# Patient Record
Sex: Female | Born: 1970 | Race: Black or African American | Hispanic: No | Marital: Single | Smoking: Never smoker
Health system: Southern US, Community
[De-identification: ages and names within clinical notes are randomized; demographics above are authoritative.]

## PROBLEM LIST (undated history)

## (undated) DIAGNOSIS — E119 Type 2 diabetes mellitus without complications: Secondary | ICD-10-CM

## (undated) DIAGNOSIS — R51 Headache: Secondary | ICD-10-CM

## (undated) DIAGNOSIS — I10 Essential (primary) hypertension: Secondary | ICD-10-CM

## (undated) DIAGNOSIS — R519 Headache, unspecified: Secondary | ICD-10-CM

---

## 2003-02-11 ENCOUNTER — Inpatient Hospital Stay (HOSPITAL_COMMUNITY): Admission: AD | Admit: 2003-02-11 | Discharge: 2003-02-11 | Payer: Self-pay | Admitting: Obstetrics & Gynecology

## 2003-02-12 ENCOUNTER — Encounter: Payer: Self-pay | Admitting: Obstetrics & Gynecology

## 2003-02-12 ENCOUNTER — Ambulatory Visit (HOSPITAL_COMMUNITY): Admission: RE | Admit: 2003-02-12 | Discharge: 2003-02-12 | Payer: Self-pay | Admitting: Family Medicine

## 2003-03-19 ENCOUNTER — Encounter: Payer: Self-pay | Admitting: Obstetrics

## 2003-03-19 ENCOUNTER — Ambulatory Visit (HOSPITAL_COMMUNITY): Admission: RE | Admit: 2003-03-19 | Discharge: 2003-03-19 | Payer: Self-pay | Admitting: Obstetrics

## 2004-06-09 ENCOUNTER — Ambulatory Visit (HOSPITAL_COMMUNITY): Admission: RE | Admit: 2004-06-09 | Discharge: 2004-06-09 | Payer: Self-pay | Admitting: Gastroenterology

## 2013-08-14 ENCOUNTER — Observation Stay: Payer: Self-pay | Admitting: Internal Medicine

## 2013-08-14 LAB — CK TOTAL AND CKMB (NOT AT ARMC)
CK, Total: 128 U/L (ref 21–215)
CK, Total: 115 U/L (ref 21–215)
CK-MB: 1.6 ng/mL (ref 0.5–3.6)
CK-MB: 1 ng/mL (ref 0.5–3.6)

## 2013-08-14 LAB — CBC
HCT: 41.7 % (ref 35.0–47.0)
HGB: 13.9 g/dL (ref 12.0–16.0)
MCH: 30.1 pg (ref 26.0–34.0)
Platelet: 254 10*3/uL (ref 150–440)
RBC: 4.61 10*6/uL (ref 3.80–5.20)

## 2013-08-14 LAB — COMPREHENSIVE METABOLIC PANEL
Albumin: 3.5 g/dL (ref 3.4–5.0)
Alkaline Phosphatase: 88 U/L
BUN: 10 mg/dL (ref 7–18)
Bilirubin,Total: 0.3 mg/dL (ref 0.2–1.0)
Chloride: 103 mmol/L (ref 98–107)
Glucose: 209 mg/dL — ABNORMAL HIGH (ref 65–99)
Potassium: 3.9 mmol/L (ref 3.5–5.1)
Sodium: 136 mmol/L (ref 136–145)
Total Protein: 7.3 g/dL (ref 6.4–8.2)

## 2013-08-14 LAB — HEMOGLOBIN A1C: Hemoglobin A1C: 9 % — ABNORMAL HIGH (ref 4.2–6.3)

## 2013-08-14 LAB — TROPONIN I
Troponin-I: <0.02 ng/mL
Troponin-I: <0.02 ng/mL
Troponin-I: 0.03 ng/mL

## 2013-08-15 LAB — BASIC METABOLIC PANEL
BUN: 10 mg/dL (ref 7–18)
Chloride: 105 mmol/L (ref 98–107)
Co2: 28 mmol/L (ref 21–32)
Creatinine: 0.66 mg/dL (ref 0.60–1.30)
EGFR (African American): >60
EGFR (Non-African Amer.): >60
Osmolality: 278 (ref 275–301)
Potassium: 4 mmol/L (ref 3.5–5.1)
Sodium: 137 mmol/L (ref 136–145)

## 2014-09-27 IMAGING — CR DG CHEST 2V
1 series · 2 of 2 positions shown · non-contrast
Comparison: August 22, 2004

CLINICAL DATA: Chest pain and shortness of breath

EXAM:
CHEST  2 VIEW

[Series 1: w chest pa · 0.14mm/px · 2 of 2 slices shown]
[im 1/2]
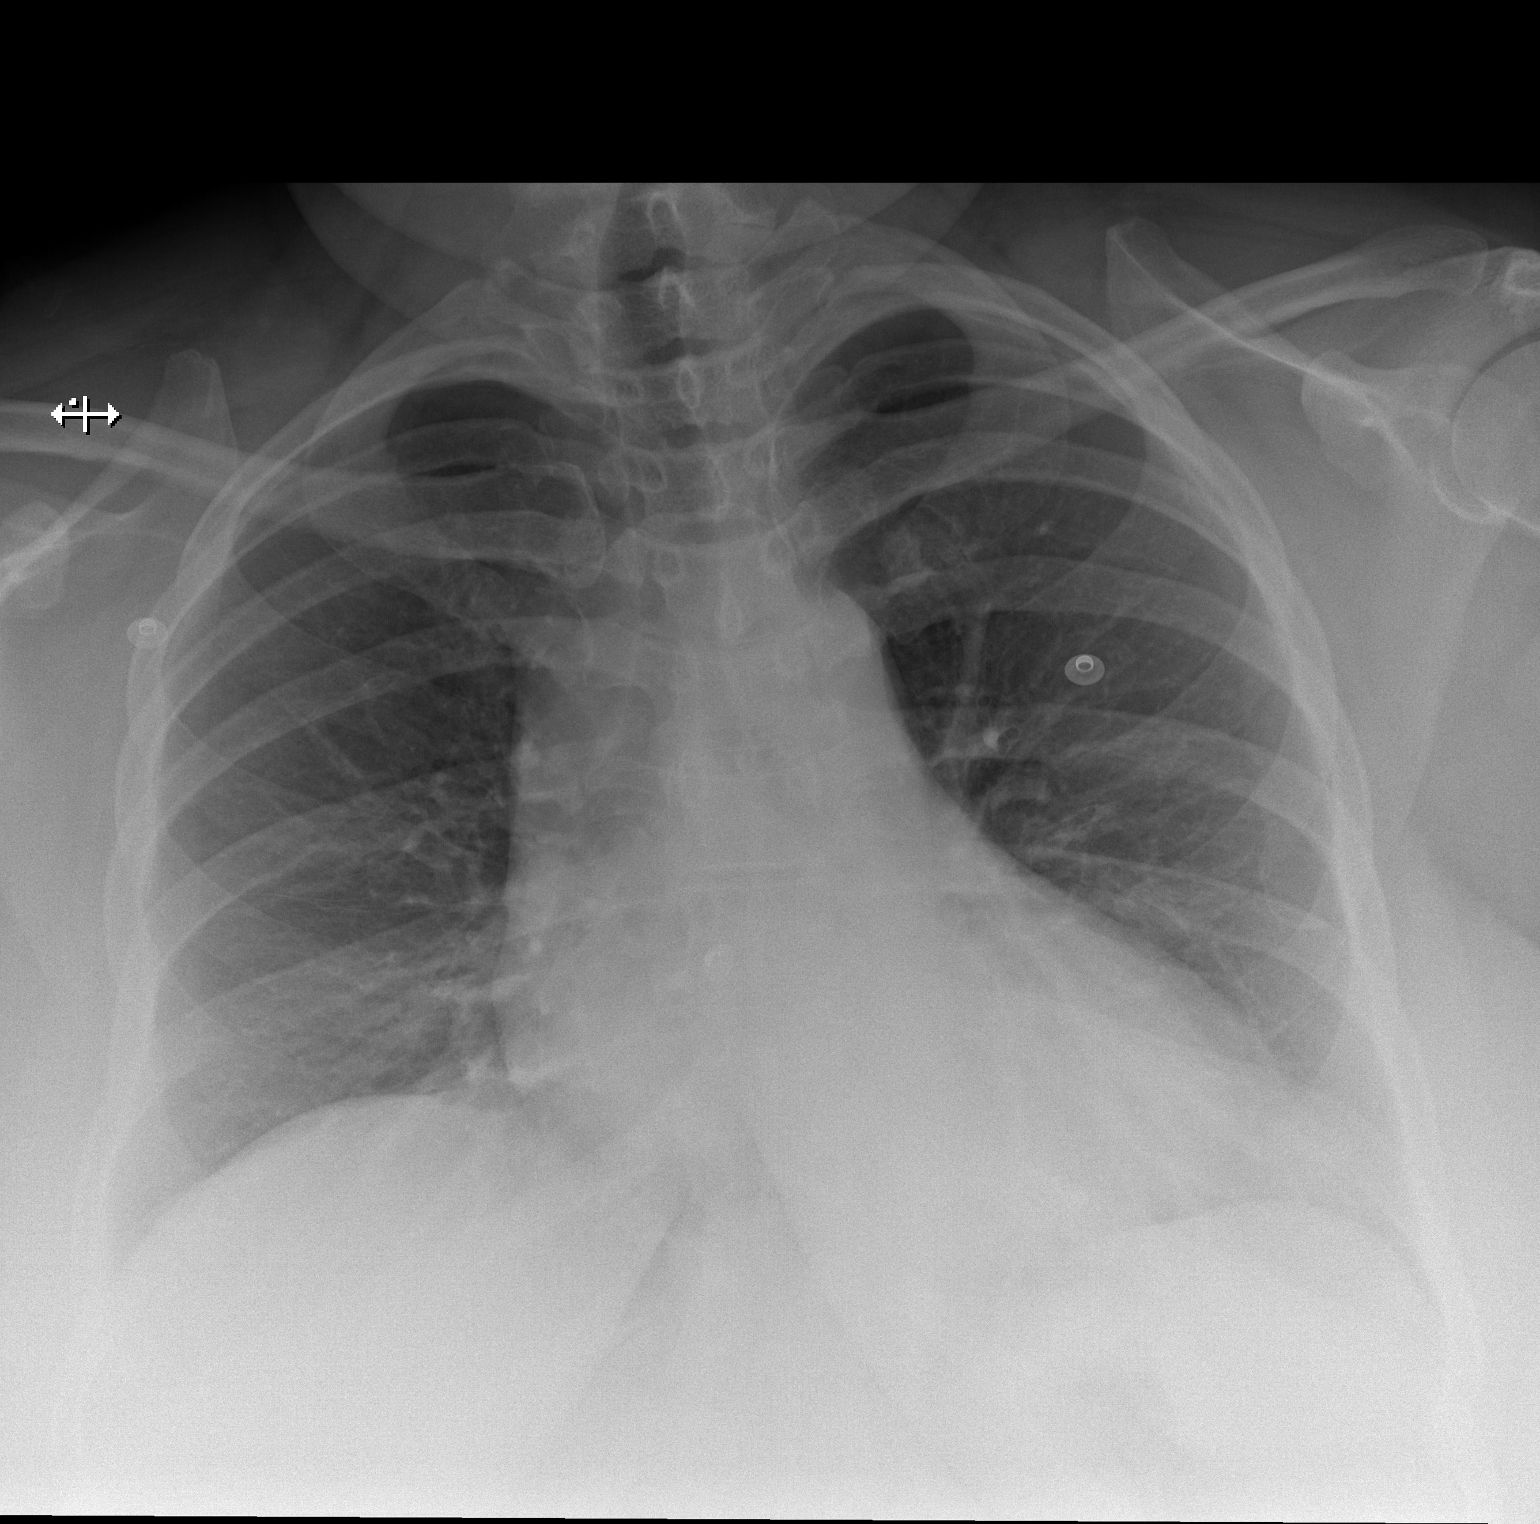
[im 2/2]
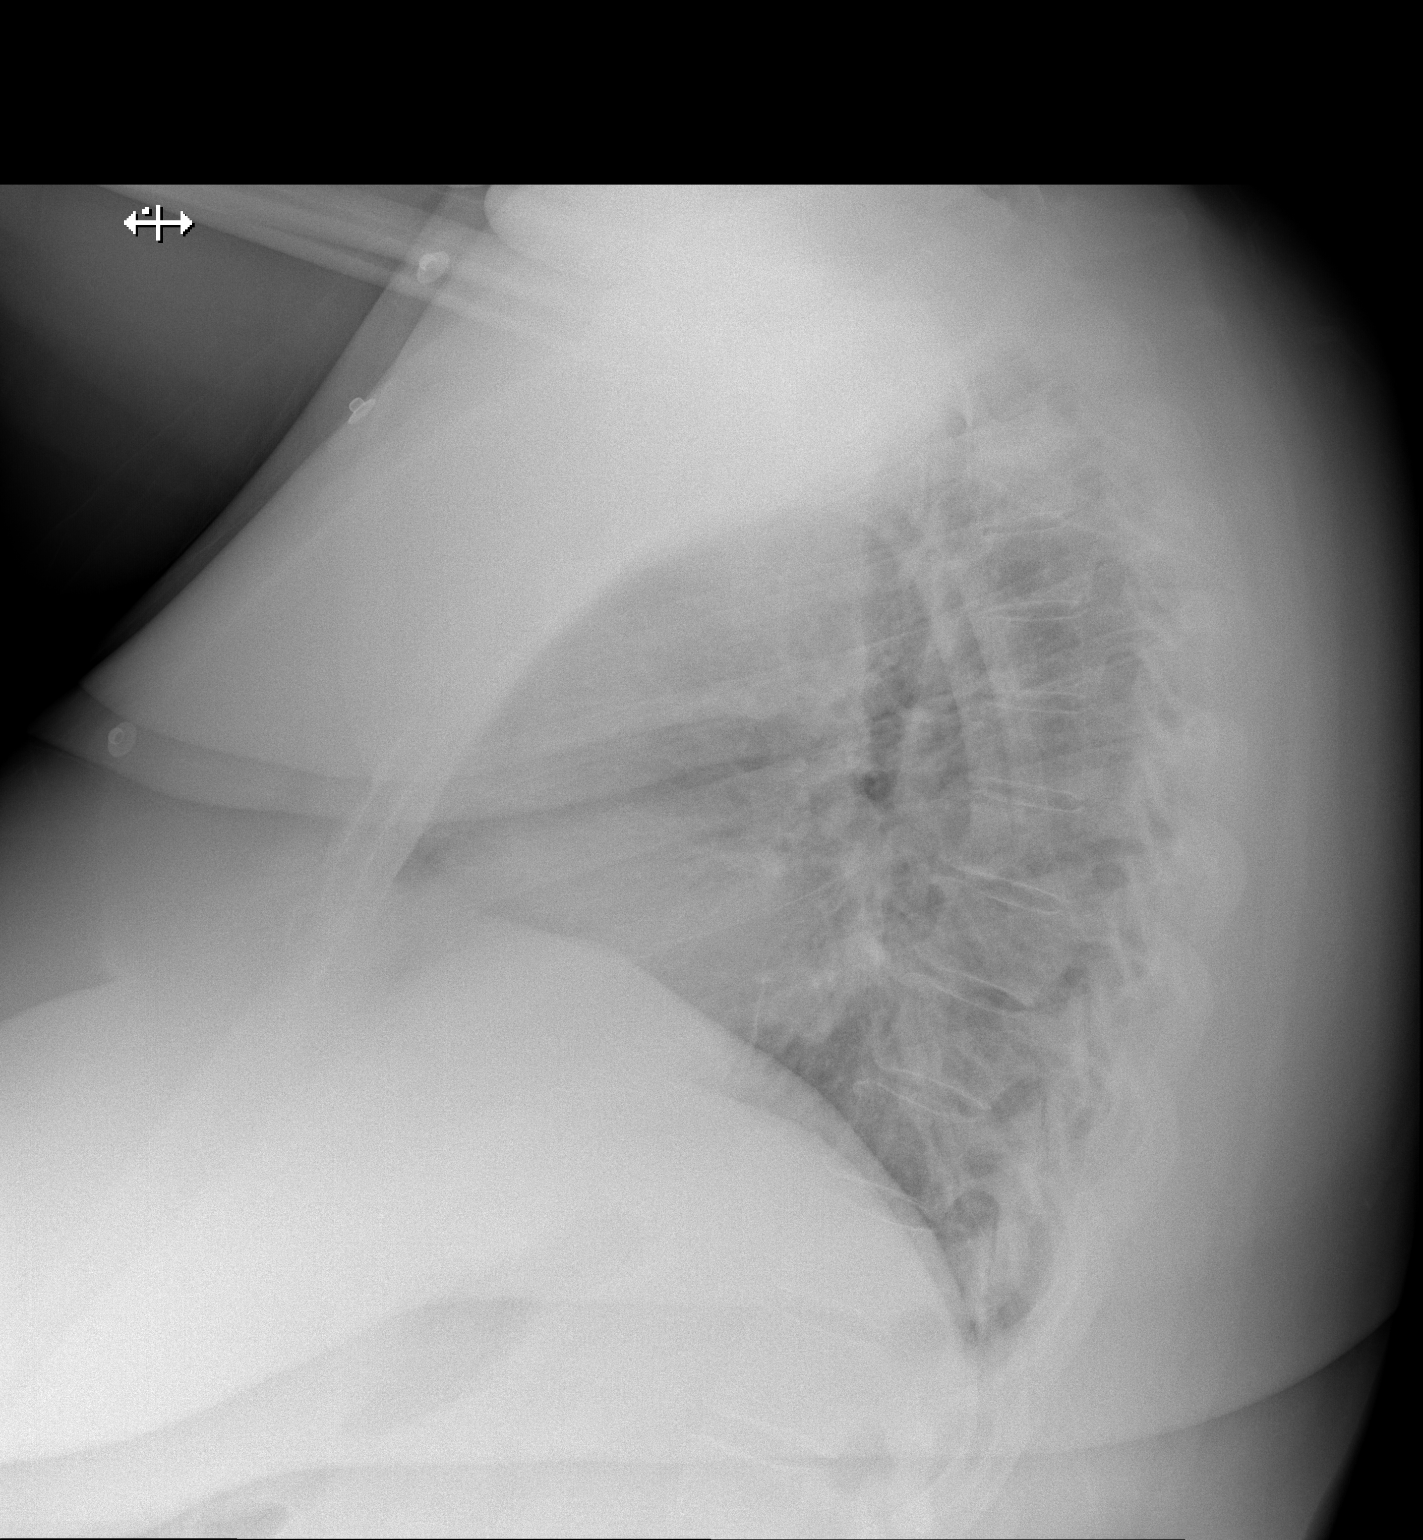

[2 of 2 positions shown; findings below may reference images not displayed]

FINDINGS: Lungs are clear. Heart is upper normal in size with normal pulmonary
vascularity. No adenopathy. No pneumothorax. No bone lesions.
IMPRESSION: No edema or consolidation.

## 2014-12-31 NOTE — Discharge Summary (Signed)
PATIENT NAME:  Amy Austin, MORUA MR#:  [id number] DATE OF BIRTH:  [birth year]  DATE OF ADMISSION:  08/14/2013 DATE OF DISCHARGE:  08/16/2013  DISCHARGE DIAGNOSES: 1.  Accelerated hypertension.  2.  Diabetes mellitus type 2.  3.  Nonischemic cardiomyopathy.  4.  Obesity.   DISCHARGE MEDICATIONS: Include:  1.  Lisinopril 10 mg oral daily. 2.  Glipizide 5 mg oral daily.  3.  Aspirin 81 mg daily.  4.  Coreg 25 mg oral 2 times a day. 5.  Glucometer testing supplies given.   DISCHARGE INSTRUCTIONS: Low-sodium, low-fat, low-cholesterol diet. Activity as tolerated. Follow up with primary care physician in 1 to 2 weeks. Check blood sugars 2 times a day on waking up and bedtime, keep a log and take to doctor's appointment.   ADMITTING HISTORY AND PHYSICAL AND HOSPITAL COURSE:  Please see dictated H and P dictated by Dr. Benjie Karvonen. In brief, a 44 year old obese African American female patient presented to the hospital complaining of chest pain, mild headache, dizziness. The patient was found to have accelerated hypertension. EKG, cardiac enzymes are normal. Cardiac stress test was done which did not show any acute abnormalities. She was diagnosed with diabetes during the hospital stay, started on metformin but she had some nausea, diarrhea and this was stopped. The patient will be continued on glipizide, given testing supplies to check it twice a day in the morning and at bedtime. The patient has been advised to lose weight. Blood pressure is much improved. She has been asked to be compliant with medications, started on an aspirin. The patient will need her fasting lipids checked with her primary care physician once her diabetes is better controlled and started on the needed medication, possibly Zocor.   Prior to discharge, the patient does not have any chest pain. Cardiac examination shows S1, S2, without any murmurs. No edema.   TIME SPENT: On day of discharge in discharge activity was 45 minutes.    ____________________________ Leia Alf Kelse Ploch, MD srs:ce D: 08/19/2013 13:50:07 ET T: 08/19/2013 14:01:58 ET JOB#: [id number]  cc: Alveta Heimlich R. Hesston Hitchens, MD, <Dictator> Neita Carp MD ELECTRONICALLY SIGNED 08/19/2013 14:59

## 2014-12-31 NOTE — H&P (Signed)
PATIENT NAME:  Amy Austin, WUEST MR#:  [id number] DATE OF BIRTH:  04/01/1971  DATE OF ADMISSION:  08/14/2013  PRIMARY CARE PHYSICIAN:  Dr. Lisbeth Ply.   CHIEF COMPLAINT:  Chest pressure.   HISTORY OF PRESENT ILLNESS:  This is a very pleasant 44 year old female with a history of hypertension, has been off of her hypertensive meds for about a month due to insurance reasons who says since Wednesday, she has had this grabbing sort of chest pressure, substernally located, radiates to her neck, associated with diaphoresis and some nausea.  It comes on and off, lasting about 15 to 20 minutes each episode. No exacerbating or relieving factors. The pain at its worse is about an 8/10. Currently she is not having any pain or pressure. In the ER her blood pressure was 212/96. She has received labetalol and the blood pressure systolically is now in the 180s.   REVIEW OF SYSTEMS:  CONSTITUTIONAL:  No fever, fatigue, weakness.  EYES:  No blurred or double vision, glaucoma or cataracts. ENT:  No ear pain, hearing loss. Positive snoring.  RESPIRATORY:  No cough, wheezing, hemoptysis or COPD.Marland Kitchen  CARDIOVASCULAR:  Chest pressure. No palpitations, orthopnea, arrhythmia, dyspnea on exertion.  GASTROINTESTINAL:  No nausea, vomiting, diarrhea, abdominal pain, melena or ulcers.  GENITOURINARY:  No dysuria or hematuria. ENDOCRINE:  No polyuria or polydipsia. HEMATOLOGIC AND LYMPHATICS:  No anemia or easy bruising. SKIN:  No rash or lesions. MUSCULOSKELETAL:  No limited activity.  NEUROLOGIC:  No history of CVA or TIA. PSYCHIATRIC:  No history of anxiety or depression.   PAST MEDICAL HISTORY:  Hypertension.  MEDICATIONS:  Coreg 12.5 b.i.d., which she has run out of the past month.   ALLERGIES:  No known drug allergies.   SOCIAL HISTORY:  No tobacco, alcohol or drug use.   PAST SURGICAL HISTORY:  C-section.   FAMILY HISTORY:  Positive for hypertension.   PHYSICAL EXAMINATION:  VITAL SIGNS:  Temperature 98, pulse  is 76, respirations 18, blood pressure 164/100, 98% on room air.  GENERAL:  Alert and oriented, not in acute distress.  HEENT:  Head is atraumatic. Pupils are round. Sclerae anicteric. Mucous membranes are moist.  OROPHARYNX:  Clear.  NECK:  Supple without JVD, carotid bruit or enlarged thyroid.  CARDIOVASCULAR:  Regular rate and rhythm. No murmurs, gallops, rubs. PMI is not displaced.  LUNGS:  Clear to auscultation without crackles, rales or rhonchi or wheezing. Normal to percussion. ABDOMEN:  Bowel sounds are positive. Nontender. Hard to appreciate organomegaly due to body habitus.  EXTREMITIES:  No clubbing, cyanosis or edema.  NEUROLOGIC:  Cranial nerves II through XII are intact. There are no focal deficits.  SKIN:  Without rash or lesions. MUSCULOSKELETAL:  No pathology to the digits or nails.   LABORATORY, DIAGNOSTIC AND RADIOLOGICAL DATA:  Sodium 136, potassium 3.9, chloride 103, bicarb 27, BUN 10, creatinine 0.60, glucose is 209, alk phos is 88, bilirubin 0.3, calcium 8.9, ALT 40, AST 29, total protein 7.3, albumin 3.5. BNP is 27. White blood cells 8.6, hemoglobin 14, hematocrit 42, platelets of 254. Troponin less than 0.02. CK 120, CPK-MB 1.3. Chest x-ray shows no acute cardiopulmonary disease. EKG:  Normal sinus rhythm. There are no ST elevations or depressions.   ASSESSMENT AND PLAN:  This is a 44 year old female who presented with unstable angina, has not taken her hypertensive medications for a month and also with malignant hypertension.  1.  Unstable angina with new-onset chest pressure lasting 15 to 20 minutes. No exacerbating or relieving  factors. Her first set of troponins are negative. We will continue to monitor troponins. If these are in negative, the patient will undergo a stress test in the a.m. I have started aspirin, checking fasting lipids and restarted her Coreg. I have added nitroglycerin to her regimen, as well as a full dose of Lovenox.  2.  Malignant hypertension: Her  blood pressure upon arrival was 952 systolically. It is now improved with labetalol. We will continue Coreg. I have added lisinopril and nitroglycerin due to problem number 1 and we will continue to monitor. 3.  Snoring, probable sleep apnea. We will order a CPAP at night. I did discuss with the patient that she will need a sleep study as an outpatient.  4.  Hyperglycemia. We will check a hemoglobin A1c.   CODE STATUS:  The patient is full CODE STATUS.    TIME SPENT:  Approximately 45 minutes.   ____________________________ Donell Beers. Benjie Karvonen, MD spm:jm D: 08/14/2013 12:53:21 ET T: 08/14/2013 13:25:10 ET JOB#: [id number]  cc: Anai Lipson P. Benjie Karvonen, MD, <Dictator> Maura L. Hamrick, MD Donell Beers Mitra Duling MD ELECTRONICALLY SIGNED 08/14/2013 15:26

## 2016-08-13 ENCOUNTER — Encounter (HOSPITAL_COMMUNITY): Payer: Self-pay | Admitting: *Deleted

## 2016-08-13 ENCOUNTER — Inpatient Hospital Stay (HOSPITAL_COMMUNITY)
Admission: AD | Admit: 2016-08-13 | Discharge: 2016-08-13 | Disposition: A | Discharge status: Home / Self Care | Payer: Self-pay | Source: Ambulatory Visit | Attending: Family Medicine | Admitting: Family Medicine

## 2016-08-13 DIAGNOSIS — D259 Leiomyoma of uterus, unspecified: Secondary | ICD-10-CM | POA: Insufficient documentation

## 2016-08-13 DIAGNOSIS — N760 Acute vaginitis: Secondary | ICD-10-CM | POA: Insufficient documentation

## 2016-08-13 DIAGNOSIS — R739 Hyperglycemia, unspecified: Secondary | ICD-10-CM

## 2016-08-13 DIAGNOSIS — B9689 Other specified bacterial agents as the cause of diseases classified elsewhere: Secondary | ICD-10-CM

## 2016-08-13 DIAGNOSIS — R109 Unspecified abdominal pain: Secondary | ICD-10-CM

## 2016-08-13 DIAGNOSIS — R42 Dizziness and giddiness: Secondary | ICD-10-CM | POA: Insufficient documentation

## 2016-08-13 DIAGNOSIS — Z3202 Encounter for pregnancy test, result negative: Secondary | ICD-10-CM

## 2016-08-13 DIAGNOSIS — I10 Essential (primary) hypertension: Secondary | ICD-10-CM

## 2016-08-13 HISTORY — DX: Essential (primary) hypertension: I10

## 2016-08-13 HISTORY — DX: Type 2 diabetes mellitus without complications: E11.9

## 2016-08-13 HISTORY — DX: Headache, unspecified: R51.9

## 2016-08-13 HISTORY — DX: Headache: R51

## 2016-08-13 LAB — URINALYSIS, ROUTINE W REFLEX MICROSCOPIC
BILIRUBIN URINE: NEGATIVE
Glucose, UA: >1000 mg/dL — AB
Ketones, ur: NEGATIVE mg/dL
Leukocytes, UA: NEGATIVE
NITRITE: NEGATIVE
PROTEIN: NEGATIVE mg/dL
Specific Gravity, Urine: 1.01 (ref 1.005–1.030)
pH: 6 (ref 5.0–8.0)

## 2016-08-13 LAB — WET PREP, GENITAL
SPERM: NONE SEEN
Trich, Wet Prep: NONE SEEN
Yeast Wet Prep HPF POC: NONE SEEN

## 2016-08-13 LAB — COMPREHENSIVE METABOLIC PANEL
ALBUMIN: 3.6 g/dL (ref 3.5–5.0)
ALT: 26 U/L (ref 14–54)
ANION GAP: 10 (ref 5–15)
AST: 22 U/L (ref 15–41)
Alkaline Phosphatase: 81 U/L (ref 38–126)
BUN: 10 mg/dL (ref 6–20)
CHLORIDE: 99 mmol/L — AB (ref 101–111)
CO2: 24 mmol/L (ref 22–32)
Calcium: 9 mg/dL (ref 8.9–10.3)
Creatinine, Ser: 0.64 mg/dL (ref 0.44–1.00)
GFR calc Af Amer: >60 mL/min (ref >60)
GFR calc non Af Amer: >60 mL/min (ref >60)
GLUCOSE: 382 mg/dL — AB (ref 65–99)
POTASSIUM: 4.2 mmol/L (ref 3.5–5.1)
SODIUM: 133 mmol/L — AB (ref 135–145)
TOTAL PROTEIN: 7.2 g/dL (ref 6.5–8.1)
Total Bilirubin: 0.8 mg/dL (ref 0.3–1.2)

## 2016-08-13 LAB — HCG, QUANTITATIVE, PREGNANCY

## 2016-08-13 LAB — CBC
HCT: 42.9 % (ref 36.0–46.0)
Hemoglobin: 15.3 g/dL — ABNORMAL HIGH (ref 12.0–15.0)
MCH: 30.8 pg (ref 26.0–34.0)
MCHC: 35.7 g/dL (ref 30.0–36.0)
MCV: 86.3 fL (ref 78.0–100.0)
PLATELETS: 276 10*3/uL (ref 150–400)
RBC: 4.97 MIL/uL (ref 3.87–5.11)
RDW: 12.5 % (ref 11.5–15.5)
WBC: 9.9 10*3/uL (ref 4.0–10.5)

## 2016-08-13 LAB — URINE MICROSCOPIC-ADD ON: WBC, UA: NONE SEEN WBC/hpf (ref 0–5)

## 2016-08-13 LAB — POCT PREGNANCY, URINE: PREG TEST UR: NEGATIVE

## 2016-08-13 LAB — GLUCOSE, CAPILLARY: Glucose-Capillary: 355 mg/dL — ABNORMAL HIGH (ref 65–99)

## 2016-08-13 MED ORDER — IBUPROFEN 600 MG PO TABS
600.0000 mg | ORAL_TABLET | Freq: Once | ORAL | Status: AC
  Administered 2016-08-13: 600 mg via ORAL
  Filled 2016-08-13: qty 1

## 2016-08-13 MED ORDER — METRONIDAZOLE 500 MG PO TABS: 500.0000 mg | ORAL_TABLET | Freq: Two times a day (BID) | ORAL | 0 refills | Status: AC

## 2016-08-13 MED ORDER — METFORMIN HCL 500 MG PO TABS: 500.0000 mg | ORAL_TABLET | Freq: Every day | ORAL | 1 refills | Status: AC

## 2016-08-13 NOTE — MAU Note (Addendum)
Gaining wt, "stomach getting big."  Did 3 pregnancy tests; 1 was positive, 1 was questionable and 1 was negative. States cramping in lower abdomen, for 2 days, dizzy. States she rarely has a period, but had one past 2 months. Was told she would not be able to get pregnant.  When she was pregnant with her daughter, she was almost 4 months when she found out, states UPT's and blood tests were negative. Denies discharge or bleeding.

## 2016-08-13 NOTE — Discharge Instructions (Signed)

## 2016-08-13 NOTE — MAU Provider Note (Signed)
History     CSN: [id number]  Arrival date and time: 08/13/16 7121   First Provider Initiated Contact with Patient 08/13/16 [phone]      Chief Complaint  Patient presents with  . Dysmenorrhea  . Dizziness   HPI   [name] is  45 y.o. female G1P1001 with a history of uterine fibroids here with multiple complaints including abnormal menstrual cycle and dizziness; she took 7 pregnancy tests at home and 1 was positive. She does not have a GYN Dr. She does not have regular menstrual cycles; she has had 3 cycles this year.  Last cycle was November 17 and it lasted 2 days.  + flutters in abdomen   She also complains of dizziness X 3 weeks.  The dizziness comes and goes. Nothing makes it better or worse.   She has a history of DM; she lost weight in the last 2 years and her PCP took her off all of her meds. She has not checked her BS since then. She was taking Metformin 500 mg daily.   OB History    Gravida Para Term Preterm AB Living   1 1 1     1    SAB TAB Ectopic Multiple Live Births           1      Past Medical History:  Diagnosis Date  . Diabetes mellitus without complication ([place])    No meds since lost wt  . Headache   . Hypertension     Past Surgical History:  Procedure Laterality Date  . CESAREAN SECTION      History reviewed. No pertinent family history.  Social History  Substance Use Topics  . Smoking status: Never Smoker  . Smokeless tobacco: Never Used  . Alcohol use No    Allergies: No Known Allergies  No prescriptions prior to admission.   Results for orders placed or performed during the hospital encounter of 08/13/16 (from the past 48 hour(s))  Urinalysis, Routine w reflex microscopic (not at [facility])     Status: Abnormal   Collection Time: 08/13/16  9:26 AM  Result Value Ref Range   Color, Urine YELLOW YELLOW   APPearance CLEAR CLEAR   Specific Gravity, Urine 1.010 1.005 - 1.030   pH 6.0 5.0 - 8.0   Glucose, UA >1000 (A) NEGATIVE  mg/dL   Hgb urine dipstick TRACE (A) NEGATIVE   Bilirubin Urine NEGATIVE NEGATIVE   Ketones, ur NEGATIVE NEGATIVE mg/dL   Protein, ur NEGATIVE NEGATIVE mg/dL   Nitrite NEGATIVE NEGATIVE   Leukocytes, UA NEGATIVE NEGATIVE  Urine microscopic-add on     Status: Abnormal   Collection Time: 08/13/16  9:26 AM  Result Value Ref Range   Squamous Epithelial / LPF 0-5 (A) NONE SEEN   WBC, UA NONE SEEN 0 - 5 WBC/hpf   RBC / HPF 0-5 0 - 5 RBC/hpf   Bacteria, UA RARE (A) NONE SEEN  Pregnancy, urine POC     Status: None   Collection Time: 08/13/16  9:30 AM  Result Value Ref Range   Preg Test, Ur NEGATIVE NEGATIVE    Comment:        THE SENSITIVITY OF THIS METHODOLOGY IS >24 mIU/mL   hCG, quantitative, pregnancy     Status: None   Collection Time: 08/13/16  9:44 AM  Result Value Ref Range   hCG, Beta Chain, Quant, S <1 <5 mIU/mL    Comment:          GEST.  AGE      CONC.  (mIU/mL)   <=1 WEEK        5 - 50     2 WEEKS       50 - 500     3 WEEKS       100 - 10,000     4 WEEKS     1,000 - 30,000     5 WEEKS     3,500 - 115,000   6-8 WEEKS     12,000 - 270,000    12 WEEKS     15,000 - 220,000        FEMALE AND NON-PREGNANT FEMALE:     LESS THAN 5 mIU/mL REPEATED TO VERIFY   CBC     Status: Abnormal   Collection Time: 08/13/16  9:44 AM  Result Value Ref Range   WBC 9.9 4.0 - 10.5 K/uL   RBC 4.97 3.87 - 5.11 MIL/uL   Hemoglobin 15.3 (H) 12.0 - 15.0 g/dL   HCT 42.9 36.0 - 46.0 %   MCV 86.3 78.0 - 100.0 fL   MCH 30.8 26.0 - 34.0 pg   MCHC 35.7 30.0 - 36.0 g/dL   RDW 12.5 11.5 - 15.5 %   Platelets 276 150 - 400 K/uL  Comprehensive metabolic panel     Status: Abnormal   Collection Time: 08/13/16  9:44 AM  Result Value Ref Range   Sodium 133 (L) 135 - 145 mmol/L   Potassium 4.2 3.5 - 5.1 mmol/L   Chloride 99 (L) 101 - 111 mmol/L   CO2 24 22 - 32 mmol/L   Glucose, Bld 382 (H) 65 - 99 mg/dL   BUN 10 6 - 20 mg/dL   Creatinine, Ser 0.64 0.44 - 1.00 mg/dL   Calcium 9.0 8.9 - 10.3 mg/dL    Total Protein 7.2 6.5 - 8.1 g/dL   Albumin 3.6 3.5 - 5.0 g/dL   AST 22 15 - 41 U/L   ALT 26 14 - 54 U/L   Alkaline Phosphatase 81 38 - 126 U/L   Total Bilirubin 0.8 0.3 - 1.2 mg/dL   GFR calc non Af Amer >60 >60 mL/min   GFR calc Af Amer >60 >60 mL/min    Comment: (NOTE) The eGFR has been calculated using the CKD EPI equation. This calculation has not been validated in all clinical situations. eGFR's persistently <60 mL/min signify possible Chronic Kidney Disease.    Anion gap 10 5 - 15  Wet prep, genital     Status: Abnormal   Collection Time: 08/13/16 10:54 AM  Result Value Ref Range   Yeast Wet Prep HPF POC NONE SEEN NONE SEEN   Trich, Wet Prep NONE SEEN NONE SEEN   Clue Cells Wet Prep HPF POC PRESENT (A) NONE SEEN   WBC, Wet Prep HPF POC FEW (A) NONE SEEN   Sperm NONE SEEN   Glucose, capillary     Status: Abnormal   Collection Time: 08/13/16 10:55 AM  Result Value Ref Range   Glucose-Capillary 355 (H) 65 - 99 mg/dL    Review of Systems  Gastrointestinal: Positive for abdominal pain (Lower abdominal pain ).  Genitourinary: Negative for dysuria and urgency.  Neurological: Positive for dizziness.   Physical Exam   Blood pressure 125/80, pulse 114, temperature 99.4 F (37.4 C), temperature source Oral, resp. rate 16, last menstrual period 07/27/2016, SpO2 98 %.  Physical Exam  Constitutional: She is oriented to person, place, and time. She appears well-developed and  well-nourished. No distress.  GI: Soft. She exhibits distension. There is generalized tenderness. There is no rigidity, no rebound and no guarding.  Genitourinary:  Genitourinary Comments: Vagina - Small amount of white vaginal discharge,+ odor  Cervix - No contact bleeding, no active bleeding  Bimanual exam: Cervix closed Uterus non tender, normal size Adnexa non tender, no masses bilaterally GC/Chlam, wet prep done Chaperone present for exam.   Musculoskeletal: Normal range of motion.  Neurological:  She is alert and oriented to person, place, and time.  Skin: Skin is warm.  Psychiatric: Her behavior is normal.    MAU Course  Procedures  None  MDM  UA Upt negative Urine shows >1000 of glucose CBC Orthostatic vitals normal  CBG: 355, unlikely DKA, CMP pending  Ibuprofen 600 mg PO, pain down from a 7/10 to 2/10.   Assessment and Plan   A:  1. Essential hypertension   2. Elevated blood sugar level   3. Encounter for pregnancy test, result negative   4. Abdominal pain in female   5. BV (bacterial vaginosis)     P:  Discharge home in stable condition  Encouraged patient to call PCP to schedule an appointment regarding BS. Needs A1C Rx: Metformin, Flagyl- no alcohol Return to MAU for emergencies only.    Lezlie Lye, NP 08/13/2016 1:28 PM

## 2016-08-14 LAB — GC/CHLAMYDIA PROBE AMP (~~LOC~~) NOT AT ARMC
Chlamydia: NEGATIVE
Neisseria Gonorrhea: NEGATIVE

## 2023-02-11 ENCOUNTER — Other Ambulatory Visit: Payer: Self-pay | Admitting: Family Medicine

## 2023-02-11 DIAGNOSIS — N6081 Other benign mammary dysplasias of right breast: Secondary | ICD-10-CM
